# Patient Record
Sex: Female | Born: 1995 | Race: Black or African American | Hispanic: Yes | Smoking: Never smoker
Health system: Southern US, Community
[De-identification: ages and names within clinical notes are randomized; demographics above are authoritative.]

---
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
## 2019-04-09 ENCOUNTER — Ambulatory Visit (HOSPITAL_COMMUNITY)
Admission: EM | Admit: 2019-04-09 | Discharge: 2019-04-09 | Disposition: A | Discharge status: Home / Self Care | Payer: Self-pay

## 2020-01-26 ENCOUNTER — Ambulatory Visit: Payer: Medicaid Other | Admitting: Family Medicine

## 2020-01-27 ENCOUNTER — Ambulatory Visit (INDEPENDENT_AMBULATORY_CARE_PROVIDER_SITE_OTHER): Payer: Self-pay

## 2020-01-27 ENCOUNTER — Encounter (HOSPITAL_COMMUNITY): Payer: Self-pay

## 2020-01-27 ENCOUNTER — Ambulatory Visit (HOSPITAL_COMMUNITY)
Admission: EM | Admit: 2020-01-27 | Discharge: 2020-01-27 | Disposition: A | Discharge status: Home / Self Care | Payer: Self-pay | Attending: Family Medicine | Admitting: Family Medicine

## 2020-01-27 ENCOUNTER — Other Ambulatory Visit: Payer: Self-pay

## 2020-01-27 DIAGNOSIS — S5002XA Contusion of left elbow, initial encounter: Secondary | ICD-10-CM

## 2020-01-27 DIAGNOSIS — M25522 Pain in left elbow: Secondary | ICD-10-CM

## 2020-01-27 DIAGNOSIS — S8001XA Contusion of right knee, initial encounter: Secondary | ICD-10-CM

## 2020-01-27 DIAGNOSIS — M25561 Pain in right knee: Secondary | ICD-10-CM

## 2020-01-27 NOTE — ED Provider Notes (Signed)
[place]    CSN: [id number] Arrival date & time: 01/27/20  1014      History   Chief Complaint Chief Complaint  Patient presents with  . arm/knee pain    HPI Donna Ruiz is a 23 y.o. female.   Initial [facility] patient visit.  Pt is here with left arm pain & right knee pain that started Wednesday after falling off a scooter.  Continued pain right knee, esp with weight bearing.  Using a knee sleeve for pain relief now.  Pain is medial joint line.  Also c/o left elbow pain with tenderness over radial head when extending forearm.  No problem with pronation or supination.  No head, chest, abdominal or other extremity injury.     Past Medical History:  Diagnosis Date  . Attention deficit disorder     Patient Active Problem List   Diagnosis Date Noted  . Attention deficit disorder     History reviewed. No pertinent surgical history.  OB History   No obstetric history on file.      Home Medications    Prior to Admission medications   Medication Sig Start Date End Date Taking? Authorizing Provider  amphetamine-dextroamphetamine (ADDERALL XR) 10 MG 24 hr capsule Take 10 mg by mouth every evening.     [provider]  amphetamine-dextroamphetamine (ADDERALL XR) 30 MG 24 hr capsule Take 30 mg by mouth every morning.    [provider]  naproxen sodium (ANAPROX) 220 MG tablet Take 440 mg by mouth 2 (two) times daily with a meal.    [provider]  sertraline (ZOLOFT) 25 MG tablet Take 25 mg by mouth daily.    [provider]    Family History Family History  Problem Relation Age of Onset  . Thyroid disease Mother   . Stroke Father     Social History Social History   Tobacco Use  . Smoking status: Never Smoker  . Smokeless tobacco: Never Used  Substance Use Topics  . Alcohol use: No  . Drug use: Yes    Types: Marijuana     Allergies   Patient has no known allergies.   Review of Systems Review of Systems   Constitutional: Negative.   HENT: Negative.   Eyes: Negative.   Respiratory: Negative.   Cardiovascular: Negative.   Gastrointestinal: Negative.   Musculoskeletal: Positive for gait problem.  Skin: Negative for color change.  All other systems reviewed and are negative.    Physical Exam Triage Vital Signs ED Triage Vitals [01/27/20 1034]  Enc Vitals Group     BP 116/78     Pulse Rate 80     Resp 16     Temp 98.3 F (36.8 C)     Temp Source Oral     SpO2 100 %     Weight 144 lb (65.3 kg)     Height      Head Circumference      Peak Flow      Pain Score      Pain Loc      Pain Edu?      Excl. in [place]?    No data found.  Updated Vital Signs BP 116/78 (BP Location: Right Arm)   Pulse 80   Temp 98.3 F (36.8 C) (Oral)   Resp 16   Wt 65.3 kg   LMP 01/10/2020   SpO2 100%    Physical Exam Vitals and nursing note reviewed.  Constitutional:  General: She is not in acute distress.    Appearance: Normal appearance. She is normal weight. She is not ill-appearing or toxic-appearing.  HENT:     Head: Normocephalic and atraumatic.     Nose: Nose normal.  Eyes:     Conjunctiva/sclera: Conjunctivae normal.  Cardiovascular:     Rate and Rhythm: Normal rate.  Pulmonary:     Effort: Pulmonary effort is normal.  Musculoskeletal:        General: Tenderness and signs of injury present. No swelling or deformity.     Cervical back: Normal range of motion and neck supple.     Comments: Left thenar area slightly bruised but not especially tender and ROM is normal in thumb.  Left wrist is normal in ROM and inspection,nontender  Left elbow is tender over radial head only, lacks 5 degrees of extension, good pronation/supination; no ecchymosis, abrasion or sts  Right knee is normal on inspection with no effusion, ligaments intact but MCL is tender.  ROM is normal   Skin:    General: Skin is warm and dry.  Neurological:     General: No focal deficit present.     Mental  Status: She is alert and oriented to person, place, and time.  Psychiatric:        Mood and Affect: Mood normal.        Behavior: Behavior normal.        Thought Content: Thought content normal.        Judgment: Judgment normal.      UC Treatments / Results  Labs (all labs ordered are listed, but only abnormal results are displayed) Labs Reviewed - No data to display  EKG   Radiology DG Elbow Complete Left  Result Date: 01/27/2020 CLINICAL DATA:  Pt fell off scooter x Wednesday, pain in left elbow. SRP EXAM: LEFT ELBOW - COMPLETE 3+ VIEW COMPARISON:  None. FINDINGS: There is no evidence of fracture, dislocation, or joint effusion. There is no evidence of arthropathy or other focal bone abnormality. Soft tissues are unremarkable. IMPRESSION: Negative. Electronically Signed   By: Corlis Leak M.D.   On: 01/27/2020 11:10    Procedures Procedures (including critical care time)  Medications Ordered in UC Medications - No data to display  Initial Impression / Assessment and Plan / UC Course  I have reviewed the triage vital signs and the nursing notes.  Pertinent labs & imaging results that were available during my care of the patient were reviewed by me and considered in my medical decision making (see chart for details).    Final Clinical Impressions(s) / UC Diagnoses   Final diagnoses:  Contusion of left elbow, initial encounter  Contusion of right knee, initial encounter     Discharge Instructions     X-rays are normal.  The knee sleeve should help with the knee pain.  Ibuprofen should be as good as anything for the discomfort.  Expect complete healing in 7-10 days    ED Prescriptions    None     I have reviewed the PDMP during this encounter.   Elvina Sidle, MD 01/27/20 1120

## 2020-01-27 NOTE — Discharge Instructions (Addendum)
X-rays are normal.  The knee sleeve should help with the knee pain.  Ibuprofen should be as good as anything for the discomfort.  Expect complete healing in 7-10 days

## 2020-01-27 NOTE — ED Triage Notes (Signed)
Pt is here with left arm pain & right knee pain that started Wednesday after falling off a scooter.

## 2020-01-30 ENCOUNTER — Ambulatory Visit: Payer: Self-pay | Admitting: Family Medicine

## 2021-04-29 IMAGING — DX DG KNEE COMPLETE 4+V*R*
4 series · 4 of 4 positions shown · non-contrast
Comparison: None.

CLINICAL DATA: Pain post fall

EXAM:
RIGHT KNEE - COMPLETE 4+ VIEW

[knee ap]
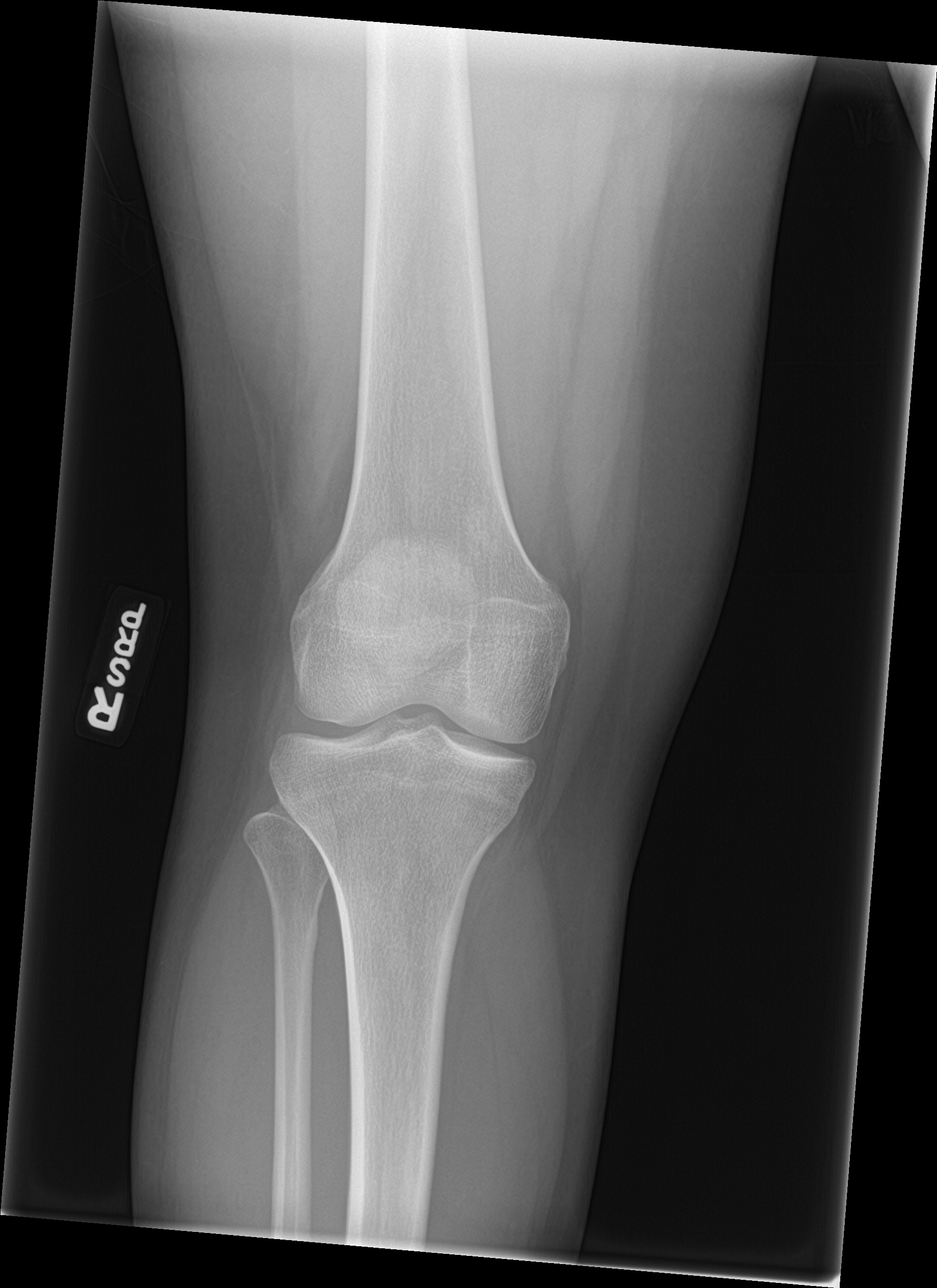

[knee obl (1 of 2)]
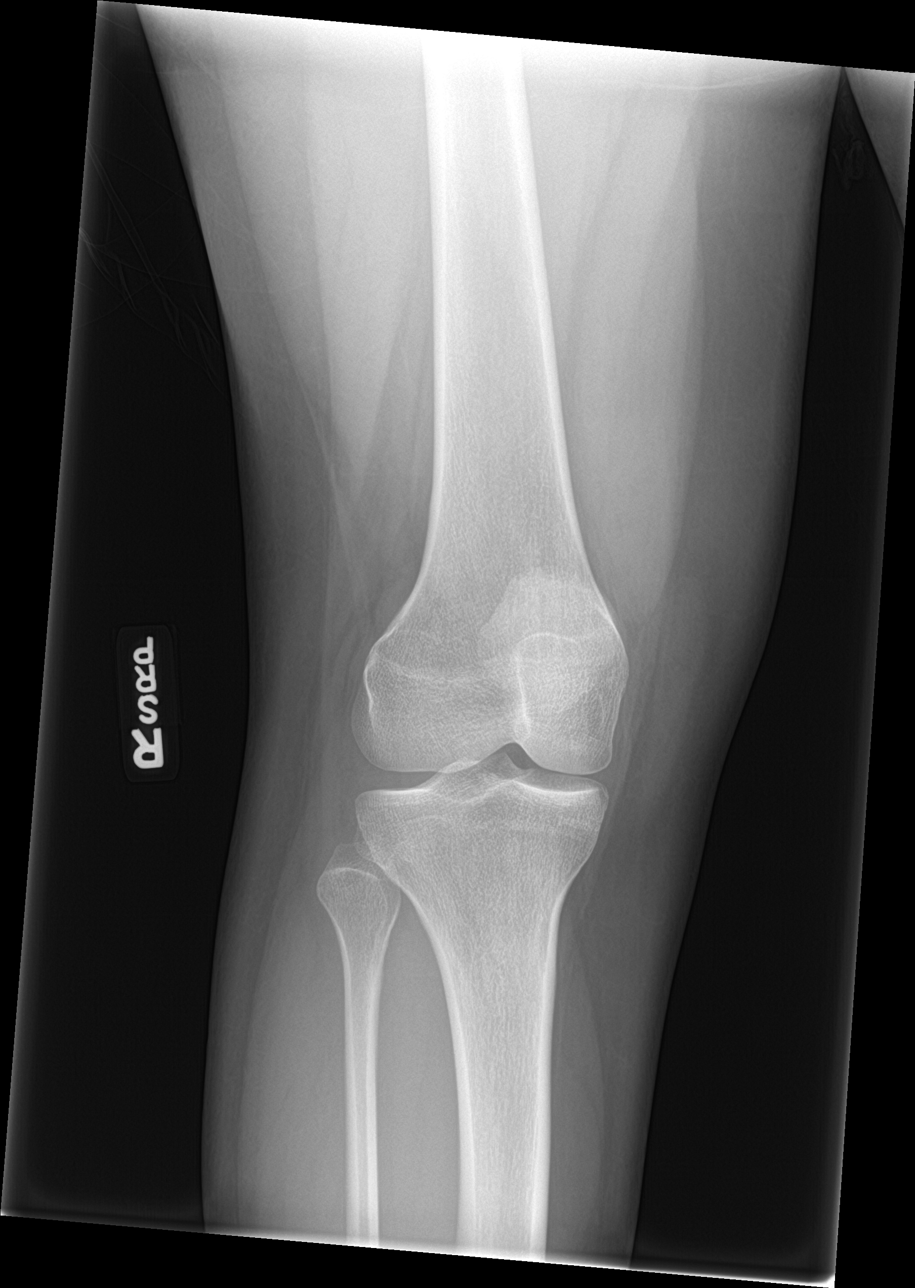

[knee obl (2 of 2)]
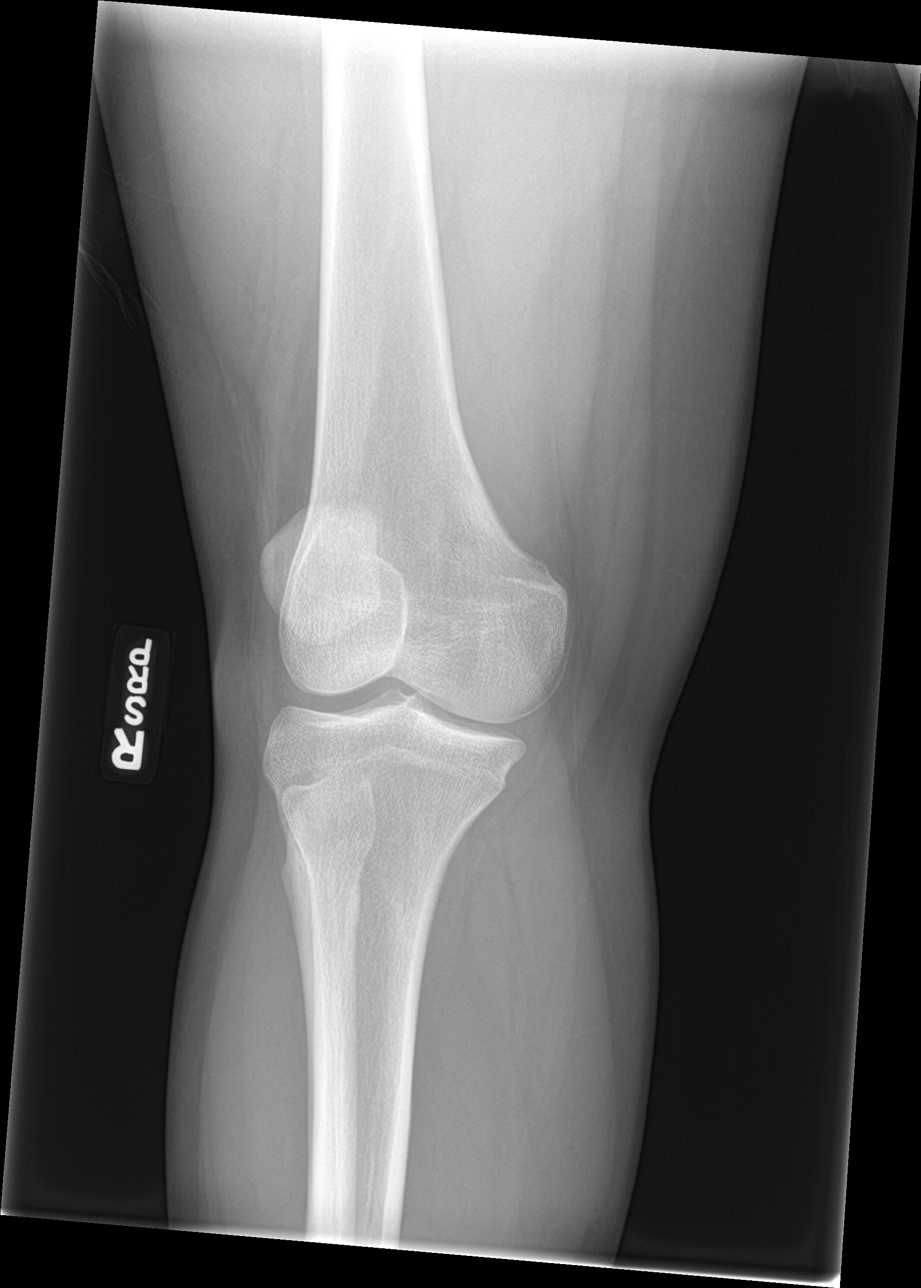

[knee lat]
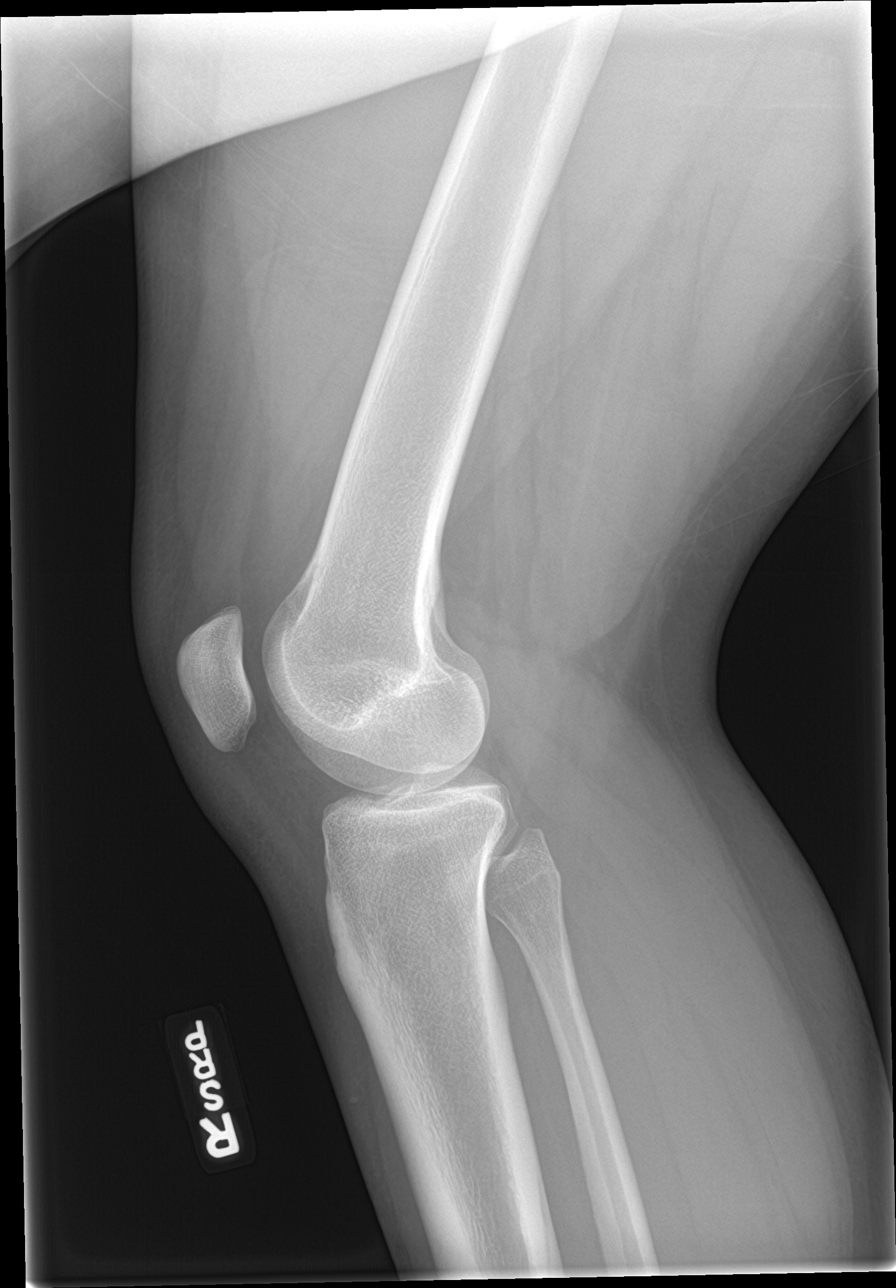

[4 of 4 positions shown; findings below may reference images not displayed]

FINDINGS: No evidence of fracture, dislocation, or joint effusion. No evidence
of arthropathy or other focal bone abnormality. Soft tissues are
unremarkable.
IMPRESSION: Negative.

## 2021-04-29 IMAGING — DX DG ELBOW COMPLETE 3+V*L*
4 series · 4 of 4 positions shown · non-contrast
Comparison: None.

CLINICAL DATA: Pt fell off scooter x [REDACTED], pain in left elbow.
KIM

EXAM:
LEFT ELBOW - COMPLETE 3+ VIEW

[elbow ap]
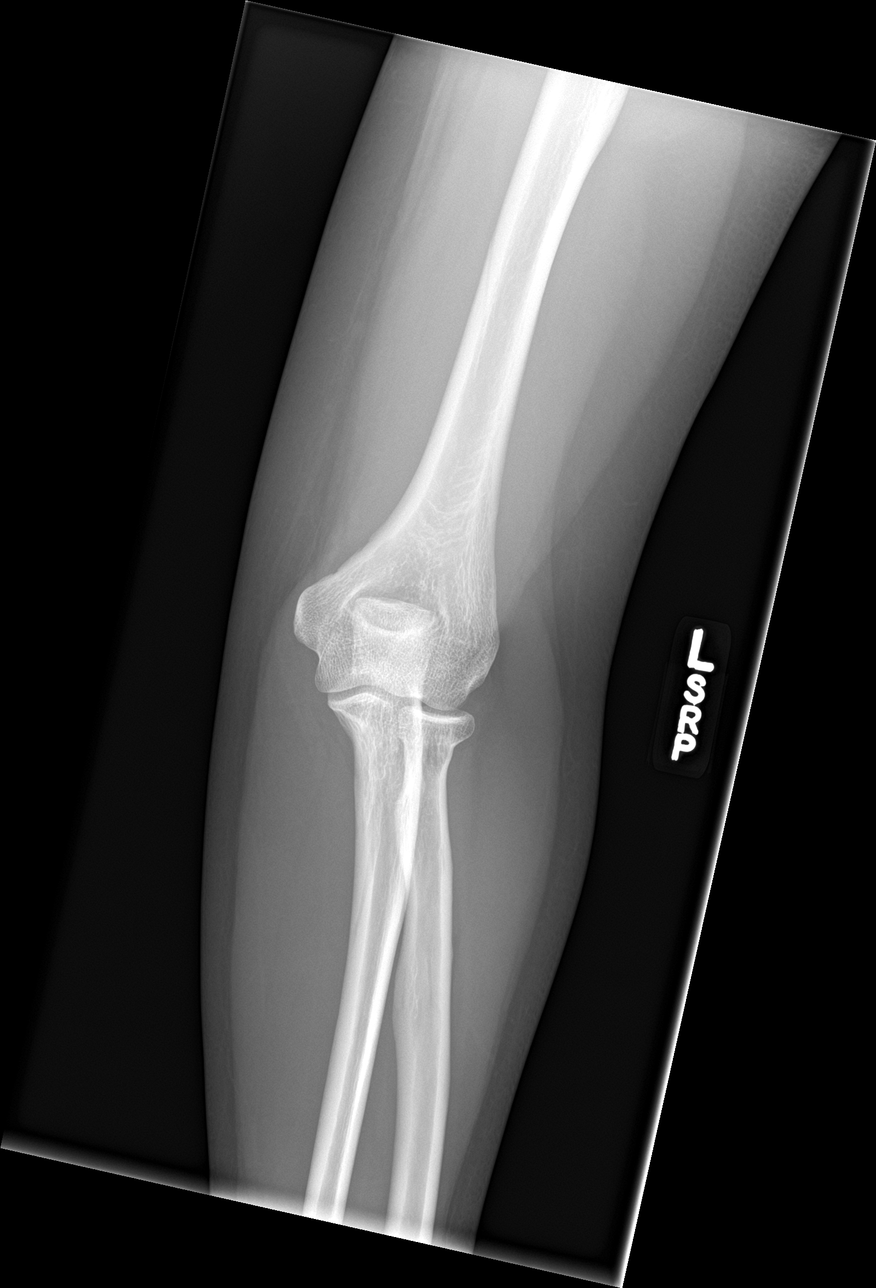

[elbow obl (1 of 2)]
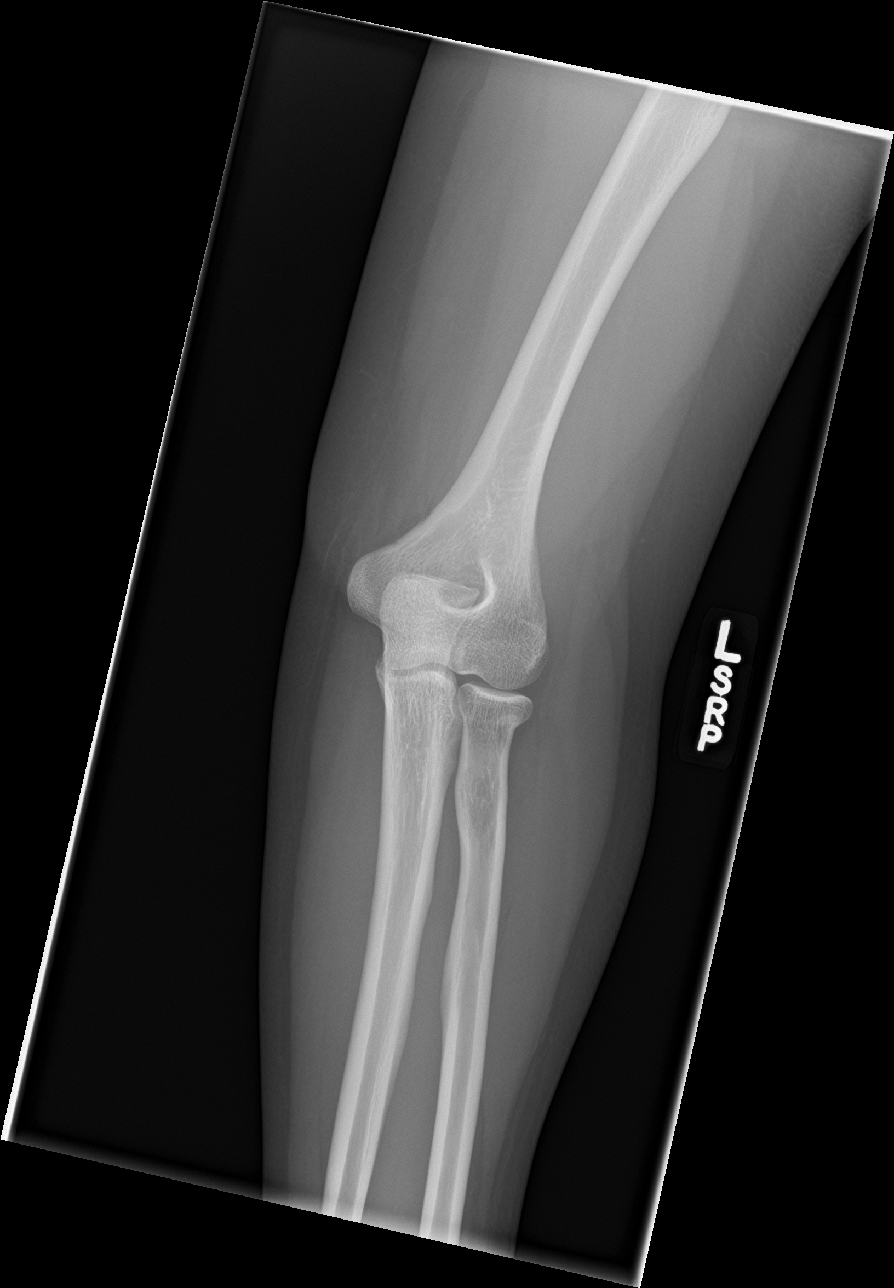

[elbow obl (2 of 2)]
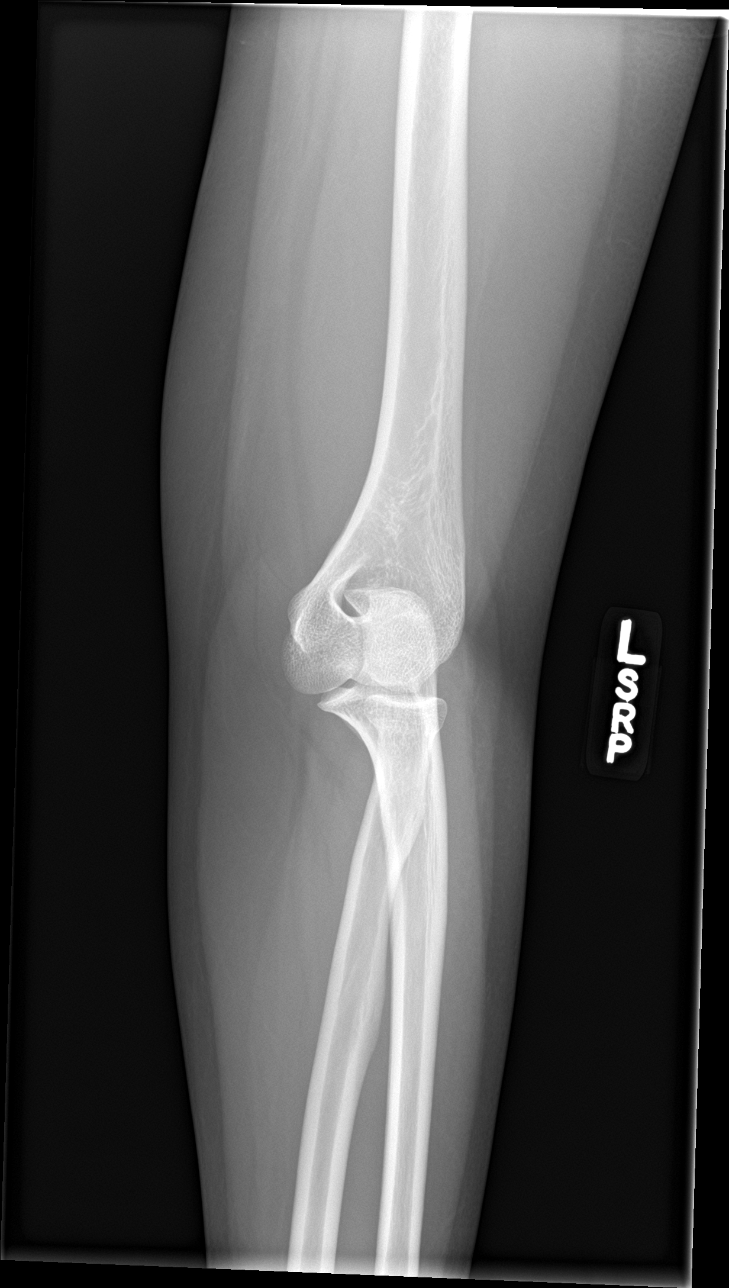

[elbow lat]
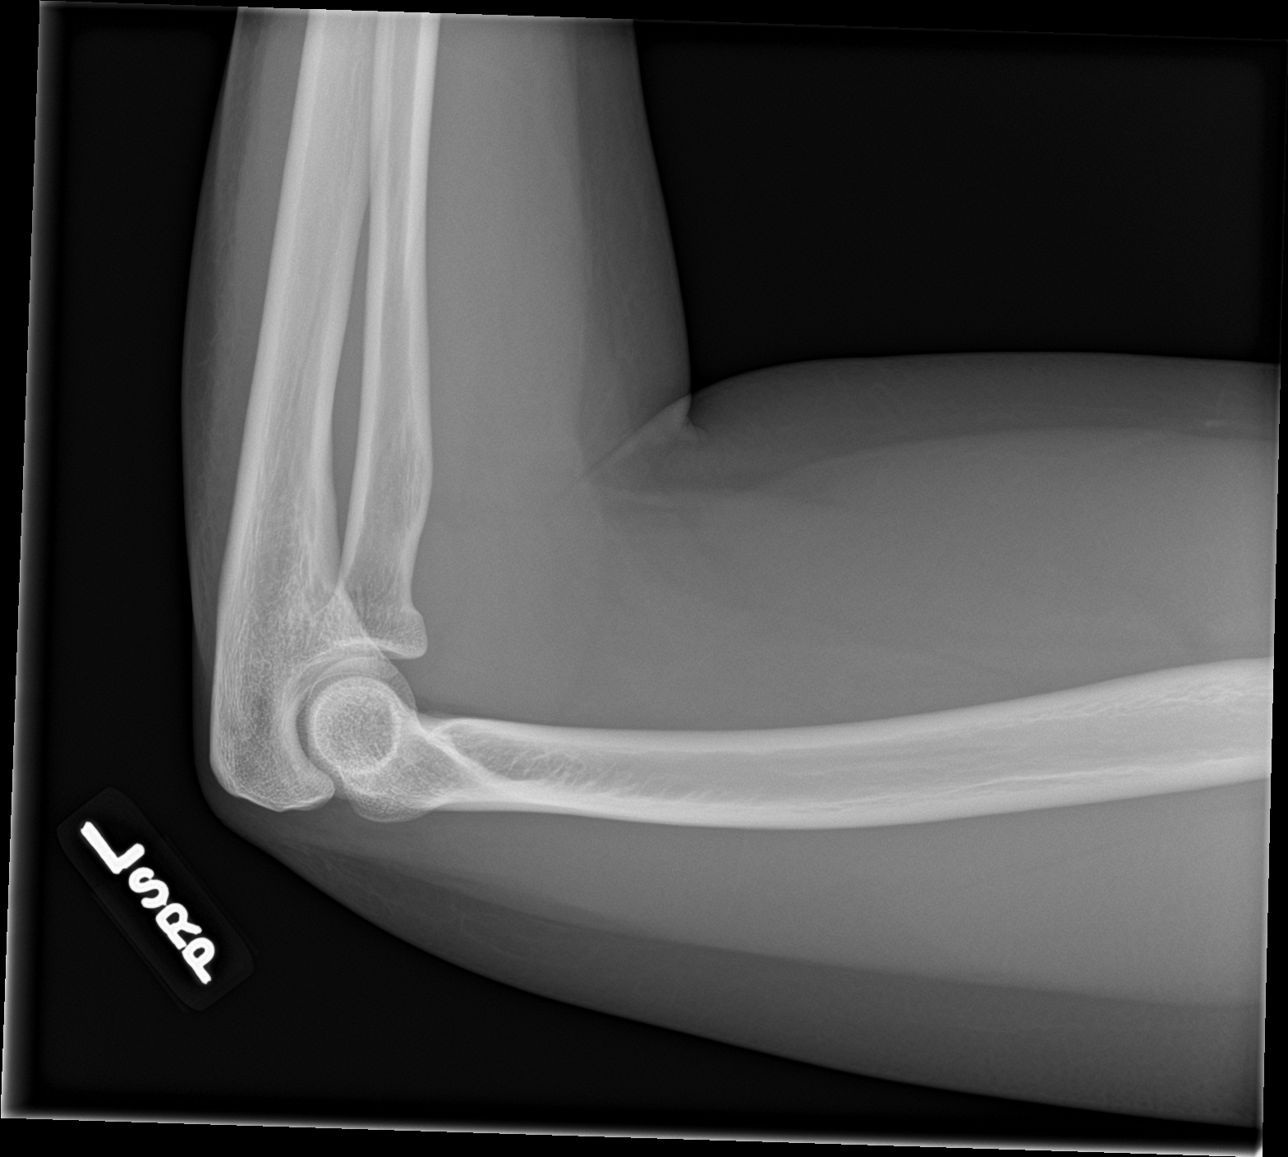

[4 of 4 positions shown; findings below may reference images not displayed]

FINDINGS: There is no evidence of fracture, dislocation, or joint effusion.
There is no evidence of arthropathy or other focal bone abnormality.
Soft tissues are unremarkable.
IMPRESSION: Negative.

## 2021-07-08 ENCOUNTER — Ambulatory Visit (INDEPENDENT_AMBULATORY_CARE_PROVIDER_SITE_OTHER): Payer: Medicaid Other

## 2021-07-08 ENCOUNTER — Other Ambulatory Visit: Payer: Self-pay

## 2021-07-08 ENCOUNTER — Ambulatory Visit (HOSPITAL_COMMUNITY)
Admission: EM | Admit: 2021-07-08 | Discharge: 2021-07-08 | Disposition: A | Discharge status: Home / Self Care | Payer: Medicaid Other | Attending: Sports Medicine | Admitting: Sports Medicine

## 2021-07-08 DIAGNOSIS — W19XXXA Unspecified fall, initial encounter: Secondary | ICD-10-CM

## 2021-07-08 DIAGNOSIS — M25572 Pain in left ankle and joints of left foot: Secondary | ICD-10-CM

## 2021-07-08 DIAGNOSIS — S93402A Sprain of unspecified ligament of left ankle, initial encounter: Secondary | ICD-10-CM

## 2021-07-08 NOTE — ED Triage Notes (Signed)
Pt fell out of bed last nigh tand has pain to Lt ankle.

## 2021-07-08 NOTE — Discharge Instructions (Signed)
Rest the ankle for the next few days, ice the ankle for 15-20 minutes at least 3 times daily.  The ankle to help with swelling relief. You may take over-the-counter ibuprofen, Aleve, or Tylenol as needed for the pain You may wear the lace up ankle brace for the next 1-2 weeks to prevent inversion/eversion of the ankle Once the pain subsides over the next 1-2 weeks, begin ankle strengthening exercises, shown in your handout If the pain does not improve over the next 2-3 weeks, would recommend following up with sports medicine
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
## 2023-02-12 ENCOUNTER — Encounter: Payer: Self-pay | Admitting: *Deleted

## 2023-04-02 ENCOUNTER — Encounter (HOSPITAL_COMMUNITY): Payer: Self-pay | Admitting: Pharmacy Technician

## 2023-04-02 ENCOUNTER — Emergency Department (HOSPITAL_COMMUNITY)
Admission: EM | Admit: 2023-04-02 | Discharge: 2023-04-02 | Disposition: A | Discharge status: Home / Self Care | Payer: No Typology Code available for payment source | Attending: Emergency Medicine | Admitting: Emergency Medicine

## 2023-04-02 ENCOUNTER — Emergency Department (HOSPITAL_COMMUNITY): Payer: Medicaid Other

## 2023-04-02 DIAGNOSIS — Y92410 Unspecified street and highway as the place of occurrence of the external cause: Secondary | ICD-10-CM | POA: Insufficient documentation

## 2023-04-02 DIAGNOSIS — M79602 Pain in left arm: Secondary | ICD-10-CM | POA: Diagnosis not present

## 2023-04-02 DIAGNOSIS — M25512 Pain in left shoulder: Secondary | ICD-10-CM | POA: Diagnosis not present

## 2023-04-02 DIAGNOSIS — M25532 Pain in left wrist: Secondary | ICD-10-CM | POA: Diagnosis present

## 2023-04-02 MED ORDER — CELECOXIB 200 MG PO CAPS: 200.0000 mg | ORAL_CAPSULE | Freq: Two times a day (BID) | ORAL | 0 refills | Status: AC

## 2023-04-02 MED ORDER — ACETAMINOPHEN 500 MG PO TABS
1000.0000 mg | ORAL_TABLET | Freq: Once | ORAL | Status: AC
  Administered 2023-04-02: 1000 mg via ORAL
  Filled 2023-04-02: qty 2

## 2023-04-02 MED ORDER — KETOROLAC TROMETHAMINE 15 MG/ML IJ SOLN
15.0000 mg | Freq: Once | INTRAMUSCULAR | Status: AC
  Administered 2023-04-02: 15 mg via INTRAMUSCULAR
  Filled 2023-04-02: qty 1
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
## 2024-05-25 ENCOUNTER — Emergency Department (HOSPITAL_COMMUNITY)
Admission: EM | Admit: 2024-05-25 | Discharge: 2024-05-25 | Disposition: A | Discharge status: Home / Self Care | Attending: Emergency Medicine | Admitting: Emergency Medicine

## 2024-05-25 ENCOUNTER — Other Ambulatory Visit: Payer: Self-pay

## 2024-05-25 ENCOUNTER — Encounter (HOSPITAL_COMMUNITY): Payer: Self-pay | Admitting: Emergency Medicine

## 2024-05-25 DIAGNOSIS — Y92410 Unspecified street and highway as the place of occurrence of the external cause: Secondary | ICD-10-CM | POA: Diagnosis not present

## 2024-05-25 DIAGNOSIS — S161XXA Strain of muscle, fascia and tendon at neck level, initial encounter: Secondary | ICD-10-CM | POA: Diagnosis not present

## 2024-05-25 DIAGNOSIS — M542 Cervicalgia: Secondary | ICD-10-CM | POA: Diagnosis present

## 2024-05-25 MED ORDER — CYCLOBENZAPRINE HCL 10 MG PO TABS: 5.0000 mg | ORAL_TABLET | Freq: Every evening | ORAL | 0 refills | Status: AC | PRN
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
